# Patient Record
Sex: Male | Born: 1990 | Race: White | Hispanic: No | Marital: Married | Smoking: Never smoker
Health system: Southern US, Community
[De-identification: ages and names within clinical notes are randomized; demographics above are authoritative.]

## PROBLEM LIST (undated history)

## (undated) DIAGNOSIS — R519 Headache, unspecified: Secondary | ICD-10-CM

## (undated) DIAGNOSIS — R51 Headache: Secondary | ICD-10-CM

## (undated) HISTORY — DX: Headache, unspecified: R51.9

## (undated) HISTORY — DX: Headache: R51

---

## 2010-07-22 ENCOUNTER — Emergency Department (HOSPITAL_COMMUNITY)
Admission: EM | Admit: 2010-07-22 | Discharge: 2010-07-22 | Payer: Self-pay | Source: Home / Self Care | Admitting: Emergency Medicine

## 2013-12-30 ENCOUNTER — Encounter (HOSPITAL_COMMUNITY): Payer: Self-pay | Admitting: Emergency Medicine

## 2013-12-30 ENCOUNTER — Emergency Department (HOSPITAL_COMMUNITY)
Admission: EM | Admit: 2013-12-30 | Discharge: 2013-12-30 | Disposition: A | Discharge status: Home / Self Care | Payer: BC Managed Care – PPO | Attending: Emergency Medicine | Admitting: Emergency Medicine

## 2013-12-30 ENCOUNTER — Emergency Department (HOSPITAL_COMMUNITY): Payer: BC Managed Care – PPO

## 2013-12-30 DIAGNOSIS — S61409A Unspecified open wound of unspecified hand, initial encounter: Secondary | ICD-10-CM | POA: Insufficient documentation

## 2013-12-30 DIAGNOSIS — S0100XA Unspecified open wound of scalp, initial encounter: Secondary | ICD-10-CM | POA: Insufficient documentation

## 2013-12-30 DIAGNOSIS — T148XXA Other injury of unspecified body region, initial encounter: Secondary | ICD-10-CM

## 2013-12-30 DIAGNOSIS — S060X9A Concussion with loss of consciousness of unspecified duration, initial encounter: Secondary | ICD-10-CM | POA: Insufficient documentation

## 2013-12-30 DIAGNOSIS — Y9389 Activity, other specified: Secondary | ICD-10-CM | POA: Insufficient documentation

## 2013-12-30 DIAGNOSIS — Y92410 Unspecified street and highway as the place of occurrence of the external cause: Secondary | ICD-10-CM | POA: Insufficient documentation

## 2013-12-30 DIAGNOSIS — IMO0002 Reserved for concepts with insufficient information to code with codable children: Secondary | ICD-10-CM | POA: Insufficient documentation

## 2013-12-30 LAB — I-STAT CHEM 8, ED
BUN: 13 mg/dL (ref 6–23)
Calcium, Ion: 1.21 mmol/L (ref 1.12–1.23)
Chloride: 103 mEq/L (ref 96–112)
Creatinine, Ser: 1 mg/dL (ref 0.50–1.35)
Glucose, Bld: 106 mg/dL — ABNORMAL HIGH (ref 70–99)
HCT: 47 % (ref 39.0–52.0)
Hemoglobin: 16 g/dL (ref 13.0–17.0)
Potassium: 4.2 mEq/L (ref 3.7–5.3)
Sodium: 145 mEq/L (ref 137–147)
TCO2: 27 mmol/L (ref 0–100)

## 2013-12-30 LAB — CBG MONITORING, ED: Glucose-Capillary: 89 mg/dL (ref 70–99)

## 2013-12-30 MED ORDER — LIDOCAINE VISCOUS 2 % MT SOLN
15.0000 mL | Freq: Once | OROMUCOSAL | Status: AC
  Administered 2013-12-30: 15 mL via OROMUCOSAL
  Filled 2013-12-30: qty 15

## 2013-12-30 MED ORDER — KETOROLAC TROMETHAMINE 60 MG/2ML IM SOLN
60.0000 mg | Freq: Once | INTRAMUSCULAR | Status: AC
  Administered 2013-12-30: 60 mg via INTRAMUSCULAR
  Filled 2013-12-30: qty 2

## 2013-12-30 MED ORDER — CLINDAMYCIN HCL 150 MG PO CAPS: 300.0000 mg | ORAL_CAPSULE | Freq: Four times a day (QID) | ORAL | Status: DC

## 2013-12-30 MED ORDER — OXYCODONE-ACETAMINOPHEN 5-325 MG PO TABS: 2.0000 | ORAL_TABLET | ORAL | Status: DC | PRN

## 2013-12-30 MED ORDER — OXYCODONE-ACETAMINOPHEN 5-325 MG PO TABS
2.0000 | ORAL_TABLET | Freq: Once | ORAL | Status: AC
  Administered 2013-12-30: 2 via ORAL
  Filled 2013-12-30: qty 2

## 2013-12-30 NOTE — Discharge Instructions (Signed)
Abrasion An abrasion is a cut or scrape of the skin. Abrasions do not extend through all layers of the skin and most heal within 10 days. It is important to care for your abrasion properly to prevent infection. CAUSES  Most abrasions are caused by falling on, or gliding across, the ground or other surface. When your skin rubs on something, the outer and inner layer of skin rubs off, causing an abrasion. DIAGNOSIS  Your caregiver will be able to diagnose an abrasion during a physical exam.  TREATMENT  Your treatment depends on how large and deep the abrasion is. Generally, your abrasion will be cleaned with water and a mild soap to remove any dirt or debris. An antibiotic ointment may be put over the abrasion to prevent an infection. A bandage (dressing) may be wrapped around the abrasion to keep it from getting dirty.  You may need a tetanus shot if:  You cannot remember when you had your last tetanus shot.  You have never had a tetanus shot.  The injury broke your skin. If you get a tetanus shot, your arm may swell, get red, and feel warm to the touch. This is common and not a problem. If you need a tetanus shot and you choose not to have one, there is a rare chance of getting tetanus. Sickness from tetanus can be serious.  HOME CARE INSTRUCTIONS   If a dressing was applied, change it at least once a day or as directed by your caregiver. If the bandage sticks, soak it off with warm water.   Wash the area with water and a mild soap to remove all the ointment 2 times a day. Rinse off the soap and pat the area dry with a clean towel.   Reapply any ointment as directed by your caregiver. This will help prevent infection and keep the bandage from sticking. Use gauze over the wound and under the dressing to help keep the bandage from sticking.   Change your dressing right away if it becomes wet or dirty.   Only take over-the-counter or prescription medicines for pain, discomfort, or fever as  directed by your caregiver.   Follow up with your caregiver within 24 48 hours for a wound check, or as directed. If you were not given a wound-check appointment, look closely at your abrasion for redness, swelling, or pus. These are signs of infection. SEEK IMMEDIATE MEDICAL CARE IF:   You have increasing pain in the wound.   You have redness, swelling, or tenderness around the wound.   You have pus coming from the wound.   You have a fever or persistent symptoms for more than 2 3 days.  You have a fever and your symptoms suddenly get worse.  You have a bad smell coming from the wound or dressing.  MAKE SURE YOU:   Understand these instructions.  Will watch your condition.  Will get help right away if you are not doing well or get worse. Document Released: 05/12/2005 Document Revised: 07/19/2012 Document Reviewed: 07/06/2011 [facility] Patient Information 2014 [place], [place].  Laceration Care, Adult A laceration is a cut or lesion that goes through all layers of the skin and into the tissue just beneath the skin. TREATMENT  Some lacerations may not require closure. Some lacerations may not be able to be closed due to an increased risk of infection. It is important to see your caregiver as soon as possible after an injury to minimize the risk of infection and maximize  the opportunity for successful closure. If closure is appropriate, pain medicines may be given, if needed. The wound will be cleaned to help prevent infection. Your caregiver will use stitches (sutures), staples, wound glue (adhesive), or skin adhesive strips to repair the laceration. These tools bring the skin edges together to allow for faster healing and a better cosmetic outcome. However, all wounds will heal with a scar. Once the wound has healed, scarring can be minimized by covering the wound with sunscreen during the day for 1 full year. HOME CARE INSTRUCTIONS  For sutures or staples:  Keep the wound clean  and dry.  If you were given a bandage (dressing), you should change it at least once a day. Also, change the dressing if it becomes wet or dirty, or as directed by your caregiver.  Wash the wound with soap and water 2 times a day. Rinse the wound off with water to remove all soap. Pat the wound dry with a clean towel.  After cleaning, apply a thin layer of the antibiotic ointment as recommended by your caregiver. This will help prevent infection and keep the dressing from sticking.  You may shower as usual after the first 24 hours. Do not soak the wound in water until the sutures are removed.  Only take over-the-counter or prescription medicines for pain, discomfort, or fever as directed by your caregiver.  Get your sutures or staples removed as directed by your caregiver. For skin adhesive strips:  Keep the wound clean and dry.  Do not get the skin adhesive strips wet. You may bathe carefully, using caution to keep the wound dry.  If the wound gets wet, pat it dry with a clean towel.  Skin adhesive strips will fall off on their own. You may trim the strips as the wound heals. Do not remove skin adhesive strips that are still stuck to the wound. They will fall off in time. For wound adhesive:  You may briefly wet your wound in the shower or bath. Do not soak or scrub the wound. Do not swim. Avoid periods of heavy perspiration until the skin adhesive has fallen off on its own. After showering or bathing, gently pat the wound dry with a clean towel.  Do not apply liquid medicine, cream medicine, or ointment medicine to your wound while the skin adhesive is in place. This may loosen the film before your wound is healed.  If a dressing is placed over the wound, be careful not to apply tape directly over the skin adhesive. This may cause the adhesive to be pulled off before the wound is healed.  Avoid prolonged exposure to sunlight or tanning lamps while the skin adhesive is in place.  Exposure to ultraviolet light in the first year will darken the scar.  The skin adhesive will usually remain in place for 5 to 10 days, then naturally fall off the skin. Do not pick at the adhesive film. You may need a tetanus shot if:  You cannot remember when you had your last tetanus shot.  You have never had a tetanus shot. If you get a tetanus shot, your arm may swell, get red, and feel warm to the touch. This is common and not a problem. If you need a tetanus shot and you choose not to have one, there is a rare chance of getting tetanus. Sickness from tetanus can be serious. SEEK MEDICAL CARE IF:   You have redness, swelling, or increasing pain in the wound.  You  see a red line that goes away from the wound.  You have yellowish-white fluid (pus) coming from the wound.  You have a fever.  You notice a bad smell coming from the wound or dressing.  Your wound breaks open before or after sutures have been removed.  You notice something coming out of the wound such as wood or glass.  Your wound is on your hand or foot and you cannot move a finger or toe. SEEK IMMEDIATE MEDICAL CARE IF:   Your pain is not controlled with prescribed medicine.  You have severe swelling around the wound causing pain and numbness or a change in color in your arm, hand, leg, or foot.  Your wound splits open and starts bleeding.  You have worsening numbness, weakness, or loss of function of any joint around or beyond the wound.  You develop painful lumps near the wound or on the skin anywhere on your body. MAKE SURE YOU:   Understand these instructions.  Will watch your condition.  Will get help right away if you are not doing well or get worse. Document Released: 08/02/2005 Document Revised: 10/25/2011 Document Reviewed: 01/26/2011 [facility] Patient Information 2014 [place], [place].  Stitches, Staples, or Skin Adhesive Strips  Stitches (sutures), staples, and skin adhesive strips hold the  skin together as it heals. They will usually be in place for 7 days or less. HOME CARE  Wash your hands with soap and water before and after you touch your wound.  Only take medicine as told by your doctor.  Cover your wound only if your doctor told you to. Otherwise, leave it open to air.  Do not get your stitches wet or dirty. If they get dirty, dab them gently with a clean washcloth. Wet the washcloth with soapy water. Do not rub. Pat them dry gently.  Do not put medicine or medicated cream on your stitches unless your doctor told you to.  Do not take out your own stitches or staples. Skin adhesive strips will fall off by themselves.  Do not pick at the wound. Picking can cause an infection.  Do not miss your follow-up appointment.  If you have problems or questions, call your doctor. GET HELP RIGHT AWAY IF:   You have a temperature by mouth above 102 F (38.9 C), not controlled by medicine.  You have chills.  You have redness or pain around your stitches.  There is puffiness (swelling) around your stitches.  You notice fluid (drainage) from your stitches.  There is a bad smell coming from your wound. MAKE SURE YOU:  Understand these instructions.  Will watch your condition.  Will get help if you are not doing well or get worse. Document Released: 05/30/2009 Document Revised: 10/25/2011 Document Reviewed: 05/30/2009 [facility] Patient Information 2014 [place], [place]. Concussion, Adult A concussion, or closed-head injury, is a brain injury caused by a direct blow to the head or by a quick and sudden movement (jolt) of the head or neck. Concussions are usually not life-threatening. Even so, the effects of a concussion can be serious. If you have had a concussion before, you are more likely to experience concussion-like symptoms after a direct blow to the head.  CAUSES   Direct blow to the head, such as from running into another player during a soccer game, being hit in  a fight, or hitting your head on a hard surface.  A jolt of the head or neck that causes the brain to move back and forth inside  the skull, such as in a car crash. SIGNS AND SYMPTOMS  The signs of a concussion can be hard to notice. Early on, they may be missed by you, family members, and health care providers. You may look fine but act or feel differently. Symptoms are usually temporary, but they may last for days, weeks, or even longer. Some symptoms may appear right away while others may not show up for hours or days. Every head injury is different. Symptoms include:   Mild to moderate headaches that will not go away.  A feeling of pressure inside your head.  Having more trouble than usual:   Learning or remembering things you have heard.  Answering questions.  Paying attention or concentrating.   Organizing daily tasks.   Making decisions and solving problems.   Slowness in thinking, acting or reacting, speaking, or reading.   Getting lost or being easily confused.   Feeling tired all the time or lacking energy (fatigued).   Feeling drowsy.   Sleep disturbances.   Sleeping more than usual.   Sleeping less than usual.   Trouble falling asleep.   Trouble sleeping (insomnia).   Loss of balance or feeling lightheaded or dizzy.   Nausea or vomiting.   Numbness or tingling.   Increased sensitivity to:   Sounds.   Lights.   Distractions.   Vision problems or eyes that tire easily.   Diminished sense of taste or smell.   Ringing in the ears.   Mood changes such as feeling sad or anxious.   Becoming easily irritated or angry for little or no reason.   Lack of motivation.  Seeing or hearing things other people do not see or hear (hallucinations). DIAGNOSIS  Your health care provider can usually diagnose a concussion based on a description of your injury and symptoms. He or she will ask whether you passed out (lost  consciousness) and whether you are having trouble remembering events that happened right before and during your injury.  Your evaluation might include:   A brain scan to look for signs of injury to the brain. Even if the test shows no injury, you may still have a concussion.   Blood tests to be sure other problems are not present. TREATMENT   Concussions are usually treated in an emergency department, in urgent care, or at a clinic. You may need to stay in the hospital overnight for further treatment.   Tell your health care provider if you are taking any medicines, including prescription medicines, over-the-counter medicines, and natural remedies. Some medicines, such as blood thinners (anticoagulants) and aspirin, may increase the chance of complications. Also tell your health care provider whether you have had alcohol or are taking illegal drugs. This information may affect treatment.  Your health care provider will send you home with important instructions to follow.  How fast you will recover from a concussion depends on many factors. These factors include how severe your concussion is, what part of your brain was injured, your age, and how healthy you were before the concussion.  Most people with mild injuries recover fully. Recovery can take time. In general, recovery is slower in older persons. Also, persons who have had a concussion in the past or have other medical problems may find that it takes longer to recover from their current injury. HOME CARE INSTRUCTIONS  General Instructions  Carefully follow the directions your health care provider gave you.  Only take over-the-counter or prescription medicines for pain, discomfort, or fever  as directed by your health care provider.  Take only those medicines that your health care provider has approved.  Do not drink alcohol until your health care provider says you are well enough to do so. Alcohol and certain other drugs may slow  your recovery and can put you at risk of further injury.  If it is harder than usual to remember things, write them down.  If you are easily distracted, try to do one thing at a time. For example, do not try to watch TV while fixing dinner.  Talk with family members or close friends when making important decisions.  Keep all follow-up appointments. Repeated evaluation of your symptoms is recommended for your recovery.  Watch your symptoms and tell others to do the same. Complications sometimes occur after a concussion. Older adults with a brain injury may have a higher risk of serious complications such as of a blood clot on the brain.  Tell your teachers, school nurse, school counselor, coach, athletic trainer, or work Production designer, theatre/television/filmmanager about your injury, symptoms, and restrictions. Tell them about what you can or cannot do. They should watch for:   Increased problems with attention or concentration.   Increased difficulty remembering or learning new information.   Increased time needed to complete tasks or assignments.   Increased irritability or decreased ability to cope with stress.   Increased symptoms.   Rest. Rest helps the brain to heal. Make sure you:  Get plenty of sleep at night. Avoid staying up late at night.  Keep the same bedtime hours on weekends and weekdays.  Rest during the day. Take daytime naps or rest breaks when you feel tired.  Limit activities that require a lot of thought or concentration. These includes   Doing homework or job-related work.   Watching TV.   Working on the computer.  Avoid any situation where there is potential for another head injury (football, hockey, soccer, basketball, martial arts, downhill snow sports and horseback riding). Your condition will get worse every time you experience a concussion. You should avoid these activities until you are evaluated by the appropriate follow-up caregivers. Returning To Your Regular  Activities You will need to return to your normal activities slowly, not all at once. You must give your body and brain enough time for recovery.  Do not return to sports or other athletic activities until your health care provider tells you it is safe to do so.  Ask your health care provider when you can drive, ride a bicycle, or operate heavy machinery. Your ability to react may be slower after a brain injury. Never do these activities if you are dizzy.  Ask your health care provider about when you can return to work or school. Preventing Another Concussion It is very important to avoid another brain injury, especially before you have recovered. In rare cases, another injury can lead to permanent brain damage, brain swelling, or death. The risk of this is greatest during the first 7 10 days after a head injury. Avoid injuries by:   Wearing a seat belt when riding in a car.   Drinking alcohol only in moderation.   Wearing a helmet when biking, skiing, skateboarding, skating, or doing similar activities.  Avoiding activities that could lead to a second concussion, such as contact or recreational sports, until your health care provider says it is OK.  Taking safety measures in your home.   Remove clutter and tripping hazards from floors and stairways.   Use grab  bars in bathrooms and handrails by stairs.   Place non-slip mats on floors and in bathtubs.   Improve lighting in dim areas. SEEK MEDICAL CARE IF:   You have increased problems paying attention or concentrating.   You have increased difficulty remembering or learning new information.   You need more time to complete tasks or assignments than before.   You have increased irritability or decreased ability to cope with stress.  You have more symptoms than before. Seek medical care if you have any of the following symptoms for more than 2 weeks after your injury:   Lasting (chronic) headaches.   Dizziness or  balance problems.   Nausea.  Vision problems.   Increased sensitivity to noise or light.   Depression or mood swings.   Anxiety or irritability.   Memory problems.   Difficulty concentrating or paying attention.   Sleep problems.   Feeling tired all the time. SEEK IMMEDIATE MEDICAL CARE IF:   You have severe or worsening headaches. These may be a sign of a blood clot in the brain.  You have weakness (even if only in one hand, leg, or part of the face).  You have numbness.  You have decreased coordination.   You vomit repeatedly.  You have increased sleepiness.  One pupil is larger than the other.   You have convulsions.   You have slurred speech.   You have increased confusion. This may be a sign of a blood clot in the brain.  You have increased restlessness, agitation, or irritability.   You are unable to recognize people or places.   You have neck pain.   It is difficult to wake you up.   You have unusual behavior changes.   You lose consciousness. MAKE SURE YOU:   Understand these instructions.  Will watch your condition.  Will get help right away if you are not doing well or get worse. Document Released: 10/23/2003 Document Revised: 04/04/2013 Document Reviewed: 02/22/2013 [facility] Patient Information 2014 [place], [place].
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
## 2014-01-10 ENCOUNTER — Encounter (HOSPITAL_COMMUNITY): Payer: Self-pay | Admitting: Emergency Medicine

## 2014-01-10 ENCOUNTER — Emergency Department (HOSPITAL_COMMUNITY)
Admission: EM | Admit: 2014-01-10 | Discharge: 2014-01-10 | Disposition: A | Discharge status: Home / Self Care | Payer: BC Managed Care – PPO | Source: Home / Self Care | Attending: Emergency Medicine | Admitting: Emergency Medicine

## 2014-01-10 DIAGNOSIS — T07XXXA Unspecified multiple injuries, initial encounter: Secondary | ICD-10-CM

## 2014-01-10 DIAGNOSIS — S060XAA Concussion with loss of consciousness status unknown, initial encounter: Secondary | ICD-10-CM

## 2014-01-10 DIAGNOSIS — Z4802 Encounter for removal of sutures: Secondary | ICD-10-CM

## 2014-01-10 DIAGNOSIS — S060X9A Concussion with loss of consciousness of unspecified duration, initial encounter: Secondary | ICD-10-CM

## 2014-01-10 DIAGNOSIS — S41109A Unspecified open wound of unspecified upper arm, initial encounter: Secondary | ICD-10-CM

## 2014-01-10 DIAGNOSIS — IMO0002 Reserved for concepts with insufficient information to code with codable children: Secondary | ICD-10-CM
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
## 2014-01-10 NOTE — Discharge Instructions (Signed)
Wash with soap and water and apply antibiotic ointment.  Watch for signs of infection.
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
## 2017-02-02 ENCOUNTER — Ambulatory Visit (INDEPENDENT_AMBULATORY_CARE_PROVIDER_SITE_OTHER): Payer: BLUE CROSS/BLUE SHIELD | Admitting: Family

## 2017-02-02 ENCOUNTER — Other Ambulatory Visit (INDEPENDENT_AMBULATORY_CARE_PROVIDER_SITE_OTHER): Payer: BLUE CROSS/BLUE SHIELD

## 2017-02-02 ENCOUNTER — Encounter: Payer: Self-pay | Admitting: Family

## 2017-02-02 VITALS — BP 118/74 | HR 60 | Temp 98.0°F | Ht 69.25 in | Wt 210.0 lb

## 2017-02-02 DIAGNOSIS — Z0000 Encounter for general adult medical examination without abnormal findings: Secondary | ICD-10-CM

## 2017-02-02 HISTORY — DX: Encounter for general adult medical examination without abnormal findings: Z00.00

## 2017-02-02 LAB — COMPREHENSIVE METABOLIC PANEL
ALK PHOS: 53 U/L (ref 39–117)
ALT: 28 U/L (ref 0–53)
AST: 26 U/L (ref 0–37)
Albumin: 4.7 g/dL (ref 3.5–5.2)
BUN: 17 mg/dL (ref 6–23)
CHLORIDE: 103 meq/L (ref 96–112)
CO2: 29 mEq/L (ref 19–32)
Calcium: 10 mg/dL (ref 8.4–10.5)
Creatinine, Ser: 0.83 mg/dL (ref 0.40–1.50)
GFR: 117.75 mL/min (ref >60.00)
GLUCOSE: 96 mg/dL (ref 70–99)
POTASSIUM: 4.5 meq/L (ref 3.5–5.1)
SODIUM: 140 meq/L (ref 135–145)
TOTAL PROTEIN: 7.1 g/dL (ref 6.0–8.3)
Total Bilirubin: 0.9 mg/dL (ref 0.2–1.2)

## 2017-02-02 LAB — CBC
HEMATOCRIT: 46.7 % (ref 39.0–52.0)
HEMOGLOBIN: 16.1 g/dL (ref 13.0–17.0)
MCHC: 34.6 g/dL (ref 30.0–36.0)
MCV: 91 fl (ref 78.0–100.0)
Platelets: 235 10*3/uL (ref 150.0–400.0)
RBC: 5.13 Mil/uL (ref 4.22–5.81)
RDW: 12.3 % (ref 11.5–15.5)
WBC: 5.7 10*3/uL (ref 4.0–10.5)

## 2017-02-02 LAB — LIPID PANEL
Cholesterol: 138 mg/dL (ref 0–200)
HDL: 51.6 mg/dL (ref >39.00)
LDL CALC: 77 mg/dL (ref 0–99)
NONHDL: 86.55
Total CHOL/HDL Ratio: 3
Triglycerides: 46 mg/dL (ref 0.0–149.0)
VLDL: 9.2 mg/dL (ref 0.0–40.0)

## 2017-02-02 NOTE — Patient Instructions (Signed)
Thank you for choosing [facility].  SUMMARY AND INSTRUCTIONS:  Labs:  Please stop by the lab on the lower level of the building for your blood work. Your results will be released to MyChart (or called to you) after review, usually within 72 hours after test completion. If any changes need to be made, you will be notified at that same time.  1.) The lab is open from 7:30am to 5:30 pm Monday-Friday 2.) No appointment is necessary 3.) Fasting (if needed) is 6-8 hours after food and drink; black coffee and water are okay   Follow up:  If your symptoms worsen or fail to improve, please contact our office for further instruction, or in case of emergency go directly to the emergency room at the closest medical facility.     Health Maintenance, Male A healthy lifestyle and preventive care is important for your health and wellness. Ask your health care provider about what schedule of regular examinations is right for you. What should I know about weight and diet? Eat a Healthy Diet  Eat plenty of vegetables, fruits, whole grains, low-fat dairy products, and lean protein.  Do not eat a lot of foods high in solid fats, added sugars, or salt.  Maintain a Healthy Weight Regular exercise can help you achieve or maintain a healthy weight. You should:  Do at least 150 minutes of exercise each week. The exercise should increase your heart rate and make you sweat (moderate-intensity exercise).  Do strength-training exercises at least twice a week.  Watch Your Levels of Cholesterol and Blood Lipids  Have your blood tested for lipids and cholesterol every 5 years starting at 27 years of age. If you are at high risk for heart disease, you should start having your blood tested when you are 27 years old. You may need to have your cholesterol levels checked more often if: ? Your lipid or cholesterol levels are high. ? You are older than 27 years of age. ? You are at high risk for heart  disease.  What should I know about cancer screening? Many types of cancers can be detected early and may often be prevented. Lung Cancer  You should be screened every year for lung cancer if: ? You are a current smoker who has smoked for at least 30 years. ? You are a former smoker who has quit within the past 15 years.  Talk to your health care provider about your screening options, when you should start screening, and how often you should be screened.  Colorectal Cancer  Routine colorectal cancer screening usually begins at 27 years of age and should be repeated every 5-10 years until you are 27 years old. You may need to be screened more often if early forms of precancerous polyps or small growths are found. Your health care provider may recommend screening at an earlier age if you have risk factors for colon cancer.  Your health care provider may recommend using home test kits to check for hidden blood in the stool.  A small camera at the end of a tube can be used to examine your colon (sigmoidoscopy or colonoscopy). This checks for the earliest forms of colorectal cancer.  Prostate and Testicular Cancer  Depending on your age and overall health, your health care provider may do certain tests to screen for prostate and testicular cancer.  Talk to your health care provider about any symptoms or concerns you have about testicular or prostate cancer.  Skin Cancer  Check   your skin from head to toe regularly.  Tell your health care provider about any new moles or changes in moles, especially if: ? There is a change in a mole's size, shape, or color. ? You have a mole that is larger than a pencil eraser.  Always use sunscreen. Apply sunscreen liberally and repeat throughout the day.  Protect yourself by wearing long sleeves, pants, a wide-brimmed hat, and sunglasses when outside.  What should I know about heart disease, diabetes, and high blood pressure?  If you are 18-39 years  of age, have your blood pressure checked every 3-5 years. If you are 40 years of age or older, have your blood pressure checked every year. You should have your blood pressure measured twice-once when you are at a hospital or clinic, and once when you are not at a hospital or clinic. Record the average of the two measurements. To check your blood pressure when you are not at a hospital or clinic, you can use: ? An automated blood pressure machine at a pharmacy. ? A home blood pressure monitor.  Talk to your health care provider about your target blood pressure.  If you are between 45-79 years old, ask your health care provider if you should take aspirin to prevent heart disease.  Have regular diabetes screenings by checking your fasting blood sugar level. ? If you are at a normal weight and have a low risk for diabetes, have this test once every three years after the age of 45. ? If you are overweight and have a high risk for diabetes, consider being tested at a younger age or more often.  A one-time screening for abdominal aortic aneurysm (AAA) by ultrasound is recommended for men aged 65-75 years who are current or former smokers. What should I know about preventing infection? Hepatitis B If you have a higher risk for hepatitis B, you should be screened for this virus. Talk with your health care provider to find out if you are at risk for hepatitis B infection. Hepatitis C Blood testing is recommended for:  Everyone born from 1945 through 1965.  Anyone with known risk factors for hepatitis C.  Sexually Transmitted Diseases (STDs)  You should be screened each year for STDs including gonorrhea and chlamydia if: ? You are sexually active and are younger than 27 years of age. ? You are older than 27 years of age and your health care provider tells you that you are at risk for this type of infection. ? Your sexual activity has changed since you were last screened and you are at an increased  risk for chlamydia or gonorrhea. Ask your health care provider if you are at risk.  Talk with your health care provider about whether you are at high risk of being infected with HIV. Your health care provider may recommend a prescription medicine to help prevent HIV infection.  What else can I do?  Schedule regular health, dental, and eye exams.  Stay current with your vaccines (immunizations).  Do not use any tobacco products, such as cigarettes, chewing tobacco, and e-cigarettes. If you need help quitting, ask your health care provider.  Limit alcohol intake to no more than 2 drinks per day. One drink equals 12 ounces of beer, 5 ounces of wine, or 1 ounces of hard liquor.  Do not use street drugs.  Do not share needles.  Ask your health care provider for help if you need support or information about quitting drugs.  Tell your   health care provider if you often feel depressed.  Tell your health care provider if you have ever been abused or do not feel safe at home. This information is not intended to replace advice given to you by your health care provider. Make sure you discuss any questions you have with your health care provider. Document Released: 01/29/2008 Document Revised: 03/31/2016 Document Reviewed: 05/06/2015 [employer] Interactive Patient Education  2018 [employer]  

## 2017-02-02 NOTE — Progress Notes (Signed)
Subjective:    Patient ID: Joseph Kennedy, male    DOB: 08/30/1990, 26 y.o.   MRN: [id number]  Chief Complaint  Patient presents with  . Establish Care  . Annual Exam    HPI:  Joseph SloopBrandon M Chiao is a 26 y.o. male who presents today for an annual wellness visit.   1) Health Maintenance -   Diet - Averages about 3 meals per day consisting of a regular diet; No caffeine intake.   Exercise - 4-5x per week consisting of cardio and resistance training    2) Preventative Exams / Immunizations:  Dental -- Up to date  Vision -- Due for exam    Health Maintenance  Topic Date Due  . HIV Screening  09/01/2004  . INFLUENZA VACCINE  03/16/2017  . TETANUS/TDAP  12/18/2023     There is no immunization history on file for this patient.   No Known Allergies   Outpatient Medications Prior to Visit  Medication Sig Dispense Refill  . Multiple Vitamin (MULTIVITAMIN WITH MINERALS) TABS tablet Take 1 tablet by mouth daily.    . clindamycin (CLEOCIN) 150 MG capsule Take 2 capsules (300 mg total) by mouth 4 (four) times daily. 80 capsule 0  . hydrocortisone cream 1 % Apply 1 application topically daily as needed for itching.    Marland Kitchen. oxyCODONE-acetaminophen (PERCOCET) 5-325 MG per tablet Take 2 tablets by mouth every 4 (four) hours as needed. 30 tablet 0   No facility-administered medications prior to visit.      Past Medical History:  Diagnosis Date  . Headache      No past surgical history on file.   Family History  Problem Relation Age of Onset  . Healthy Mother   . Hypertension Father   . Cancer Paternal Aunt        colon  . Diabetes Maternal Grandmother   . Hypertension Maternal Grandfather      Social History   Social History  . Marital status: Single    Spouse name: N/A  . Number of children: 0  . Years of education: 14   Occupational History  . [employer]    Social History Main Topics  . Smoking status: Never Smoker  . Smokeless  tobacco: Never Used  . Alcohol use No  . Drug use: No  . Sexual activity: Not on file   Other Topics Concern  . Not on file   Social History Narrative   Fun/Hobby: Work on cars, mountain biking      Review of Systems  Constitutional: Denies fever, chills, fatigue, or significant weight gain/loss. HENT: Head: Denies headache or neck pain Ears: Denies changes in hearing, ringing in ears, earache, drainage Nose: Denies discharge, stuffiness, itching, nosebleed, sinus pain Throat: Denies sore throat, hoarseness, dry mouth, sores, thrush Eyes: Denies loss/changes in vision, pain, redness, blurry/double vision, flashing lights Cardiovascular: Denies chest pain/discomfort, tightness, palpitations, shortness of breath with activity, difficulty lying down, swelling, sudden awakening with shortness of breath Respiratory: Denies shortness of breath, cough, sputum production, wheezing Gastrointestinal: Denies dysphasia, heartburn, change in appetite, nausea, change in bowel habits, rectal bleeding, constipation, diarrhea, yellow skin or eyes Genitourinary: Denies frequency, urgency, burning/pain, blood in urine, incontinence, change in urinary strength. Musculoskeletal: Denies muscle/joint pain, stiffness, back pain, redness or swelling of joints, trauma Skin: Denies rashes, lumps, itching, dryness, color changes, or hair/nail changes Neurological: Denies dizziness, fainting, seizures, weakness, numbness, tingling, tremor Psychiatric - Denies nervousness, stress, depression or memory loss Endocrine: Denies heat  or cold intolerance, sweating, frequent urination, excessive thirst, changes in appetite Hematologic: Denies ease of bruising or bleeding     Objective:     BP 118/74 (BP Location: Left Arm, Patient Position: Sitting, Cuff Size: Normal)   Pulse 60   Temp 98 F (36.7 C) (Oral)   Ht 5' 9.25" (1.759 m)   Wt 210 lb (95.3 kg)   SpO2 98%   BMI 30.79 kg/m  Nursing note and vital  signs reviewed.  Physical Exam  Constitutional: He is oriented to person, place, and time. He appears well-developed and well-nourished.  HENT:  Head: Normocephalic.  Right Ear: Hearing, tympanic membrane, external ear and ear canal normal.  Left Ear: Hearing, tympanic membrane, external ear and ear canal normal.  Nose: Nose normal.  Mouth/Throat: Uvula is midline, oropharynx is clear and moist and mucous membranes are normal.  Eyes: Conjunctivae and EOM are normal. Pupils are equal, round, and reactive to light.  Neck: Neck supple. No JVD present. No tracheal deviation present. No thyromegaly present.  Cardiovascular: Normal rate, regular rhythm, normal heart sounds and intact distal pulses.   Pulmonary/Chest: Effort normal and breath sounds normal.  Abdominal: Soft. Bowel sounds are normal. He exhibits no distension and no mass. There is no tenderness. There is no rebound and no guarding.  Musculoskeletal: Normal range of motion. He exhibits no edema or tenderness.  Lymphadenopathy:    He has no cervical adenopathy.  Neurological: He is alert and oriented to person, place, and time. He has normal reflexes. No cranial nerve deficit. He exhibits normal muscle tone. Coordination normal.  Skin: Skin is warm and dry.  Psychiatric: He has a normal mood and affect. His behavior is normal. Judgment and thought content normal.       Assessment & Plan:   Problem List Items Addressed This Visit      Other   Routine adult health maintenance - Primary    1) Anticipatory Guidance: Discussed importance of wearing a seatbelt while driving and not texting while driving; changing batteries in smoke detector at least once annually; wearing suntan lotion when outside; eating a balanced and moderate diet; getting physical activity at least 30 minutes per day.  2) Immunizations / Screenings / Labs:  All immunizations are up-to-date per recommendations. Due for a vision screen encouraged to be completed  independently. All other screenings are up-to-date per recommendations. Obtain CBC, CMET, and lipid profile.    Overall well exam with risk factors for cardiovascular disease being minimal. He eats well and exercises regularly. His BMI is most likely misleading secondary to muscle mass. Encouraged body fat testing to check current body habitus status. Continue healthy lifestyle behaviors and choices. Follow-up prevention exam in 1 year. Follow-up office visit pending blood work as needed.      Relevant Orders   CBC   Comprehensive metabolic panel   Lipid panel       I have discontinued Mr. Mierzejewski hydrocortisone cream, oxyCODONE-acetaminophen, and clindamycin. I am also having him maintain his multivitamin with minerals.   Follow-up: Return if symptoms worsen or fail to improve.   Jeanine Luz, FNP

## 2017-02-02 NOTE — Assessment & Plan Note (Signed)
1) Anticipatory Guidance: Discussed importance of wearing a seatbelt while driving and not texting while driving; changing batteries in smoke detector at least once annually; wearing suntan lotion when outside; eating a balanced and moderate diet; getting physical activity at least 30 minutes per day.  2) Immunizations / Screenings / Labs:  All immunizations are up-to-date per recommendations. Due for a vision screen encouraged to be completed independently. All other screenings are up-to-date per recommendations. Obtain CBC, CMET, and lipid profile.    Overall well exam with risk factors for cardiovascular disease being minimal. He eats well and exercises regularly. His BMI is most likely misleading secondary to muscle mass. Encouraged body fat testing to check current body habitus status. Continue healthy lifestyle behaviors and choices. Follow-up prevention exam in 1 year. Follow-up office visit pending blood work as needed.

## 2019-01-02 DIAGNOSIS — Z20828 Contact with and (suspected) exposure to other viral communicable diseases: Secondary | ICD-10-CM | POA: Diagnosis not present

## 2019-01-02 DIAGNOSIS — Z6834 Body mass index (BMI) 34.0-34.9, adult: Secondary | ICD-10-CM | POA: Diagnosis not present

## 2020-06-05 ENCOUNTER — Telehealth: Payer: Self-pay | Admitting: Family

## 2020-06-05 NOTE — Telephone Encounter (Signed)
That is fine. I have not been with Auberry in 3 years

## 2020-06-05 NOTE — Telephone Encounter (Signed)
12/8 appointment with debbie

## 2020-06-05 NOTE — Telephone Encounter (Signed)
OK to schedule him

## 2020-06-05 NOTE — Telephone Encounter (Signed)
Pt called wanting to transfer care from dr Carver Fila to Nicoletta Ba to schedule

## 2020-07-23 ENCOUNTER — Encounter: Payer: Self-pay | Admitting: Family Medicine

## 2020-07-23 ENCOUNTER — Ambulatory Visit (INDEPENDENT_AMBULATORY_CARE_PROVIDER_SITE_OTHER): Payer: BC Managed Care – PPO | Admitting: Family Medicine

## 2020-07-23 ENCOUNTER — Other Ambulatory Visit: Payer: Self-pay

## 2020-07-23 VITALS — BP 130/82 | HR 107 | Temp 98.7°F | Ht 70.0 in | Wt 269.1 lb

## 2020-07-23 DIAGNOSIS — Z6838 Body mass index (BMI) 38.0-38.9, adult: Secondary | ICD-10-CM | POA: Diagnosis not present

## 2020-07-23 DIAGNOSIS — E6609 Other obesity due to excess calories: Secondary | ICD-10-CM | POA: Insufficient documentation

## 2020-07-23 DIAGNOSIS — Z7689 Persons encountering health services in other specified circumstances: Secondary | ICD-10-CM

## 2020-07-23 DIAGNOSIS — Z23 Encounter for immunization: Secondary | ICD-10-CM | POA: Diagnosis not present

## 2020-07-23 DIAGNOSIS — Z800 Family history of malignant neoplasm of digestive organs: Secondary | ICD-10-CM | POA: Diagnosis not present

## 2020-07-23 DIAGNOSIS — R0683 Snoring: Secondary | ICD-10-CM | POA: Diagnosis not present

## 2020-07-23 DIAGNOSIS — Z6835 Body mass index (BMI) 35.0-35.9, adult: Secondary | ICD-10-CM | POA: Insufficient documentation

## 2020-07-23 HISTORY — DX: Other obesity due to excess calories: E66.09

## 2020-07-23 NOTE — Progress Notes (Signed)
Subjective:    Patient ID: Joseph Kennedy, male    DOB: Feb 21, 1991, 29 y.o.   MRN: [id number]  HPI Chief Complaint  Patient presents with  . New Patient (Initial Visit)   This is a 29 yo male who presents today to establish care. He is accompanied by his wife, Darl Pikes. 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 Enjoys outdoors, cutting wood. Has 2 cats and a dog.    Last CPE- several years ago Tdap- overdue, will have today Flu- regular, will have today Covid- vaccinated Dental- regular before Covid Eye- 1019 Exercise- going to gym 4-5 week Diet- smoothies, chicken, vegetables, some juice, occasional Gatorade, snack before the gym- trail mix. Wife cooks.   Family history- paternal aunt died of colon cancer, no first degree relative with colon cancer diagnosed prior to age 20  Snoring- since childhood, gotten worse over time, wife reports apneic episodes, getting worse, sleeps on sofa for several hours then goes to bed for total of about 11 hours. Feels rested. No daytime sleepiness. Broken nose as a child.   Allergic rhinitis- as a kid, improved, some chronic changes on CT. No current symptoms.     Review of Systems Denies visual change, headache, runny nose, sore throat, ear pain, chest pain, shortness of breath, abdominal pain, diarrhea/constipation, dysuria, hematuria, blood in stools, muscle or joint pain    Objective:   Physical Exam Physical Exam  Constitutional: Oriented to person, place, and time. Appears well-developed and well-nourished.  Obese HENT:  Head: Normocephalic and atraumatic.  Eyes: Conjunctivae are normal.  Neck: Normal range of motion. Neck supple.  Cardiovascular: Normal rate, regular rhythm and normal heart sounds.   Pulmonary/Chest: Effort normal and breath sounds normal.  Musculoskeletal: No lower extremity edema.   Neurological: Alert and oriented to person, place, and time.  Skin: Skin is warm and dry.  Psychiatric: Normal mood  and affect. Behavior is normal. Judgment and thought content normal.  Vitals reviewed.     BP 130/82 (BP Location: Right Arm, Patient Position: Sitting, Cuff Size: Large)   Pulse (!) 107   Temp 98.7 F (37.1 C) (Temporal)   Ht 5\' 10"  (1.778 m)   Wt 269 lb 1.9 oz (122.1 kg)   SpO2 98%   BMI 38.61 kg/m  Wt Readings from Last 3 Encounters:  07/23/20 269 lb 1.9 oz (122.1 kg)  02/02/17 210 lb (95.3 kg)  12/30/13 190 lb (86.2 kg)       Assessment & Plan:  1. Encounter to establish care -Reviewed available records in EMR and advised on health maintenance recommendations  2. Snoring - Ambulatory referral to ENT  3. Class 2 obesity due to excess calories without serious comorbidity with body mass index (BMI) of 38.0 to 38.9 in adult -Discussed diet and encouraged him to decrease his carbohydrate intake, decrease his eating window, continue activity and exercise - Lipid Panel - Comprehensive metabolic panel - CBC with Differential - TSH - Vitamin D, 25-hydroxy - Hemoglobin A1c  4. Need for immunization against influenza - Flu Vaccine QUAD 36+ mos IM  5. Need for diphtheria-tetanus-pertussis (Tdap) vaccine - Tdap vaccine greater than or equal to 7yo IM  6. Family history of colon cancer -Second-degree relative, this does not change recommendations for his personal colon cancer screening which should start at 97.  Discussed this with him and he should notify provider if any change in family history.  -Follow-up in 1 year if labs normal  This visit occurred  during the SARS-CoV-2 public health emergency.  Safety protocols were in place, including screening questions prior to the visit, additional usage of staff PPE, and extensive cleaning of exam room while observing appropriate contact time as indicated for disinfecting solutions.      Olean Ree, FNP-BC  [facility], [facility]  07/23/2020 5:18 PM

## 2020-07-24 LAB — COMPREHENSIVE METABOLIC PANEL
ALT: 71 U/L — ABNORMAL HIGH (ref 0–53)
AST: 34 U/L (ref 0–37)
Albumin: 4.6 g/dL (ref 3.5–5.2)
Alkaline Phosphatase: 73 U/L (ref 39–117)
BUN: 18 mg/dL (ref 6–23)
CO2: 29 mEq/L (ref 19–32)
Calcium: 9.7 mg/dL (ref 8.4–10.5)
Chloride: 105 mEq/L (ref 96–112)
Creatinine, Ser: 0.9 mg/dL (ref 0.40–1.50)
GFR: 114.3 mL/min (ref >60.00)
Glucose, Bld: 92 mg/dL (ref 70–99)
Potassium: 4.1 mEq/L (ref 3.5–5.1)
Sodium: 142 mEq/L (ref 135–145)
Total Bilirubin: 0.4 mg/dL (ref 0.2–1.2)
Total Protein: 7.3 g/dL (ref 6.0–8.3)

## 2020-07-24 LAB — CBC WITH DIFFERENTIAL/PLATELET
Basophils Absolute: 0.1 10*3/uL (ref 0.0–0.1)
Basophils Relative: 1 % (ref 0.0–3.0)
Eosinophils Absolute: 0.6 10*3/uL (ref 0.0–0.7)
Eosinophils Relative: 5.8 % — ABNORMAL HIGH (ref 0.0–5.0)
HCT: 47.1 % (ref 39.0–52.0)
Hemoglobin: 16.1 g/dL (ref 13.0–17.0)
Lymphocytes Relative: 31.7 % (ref 12.0–46.0)
Lymphs Abs: 3.1 10*3/uL (ref 0.7–4.0)
MCHC: 34.1 g/dL (ref 30.0–36.0)
MCV: 88 fl (ref 78.0–100.0)
Monocytes Absolute: 0.7 10*3/uL (ref 0.1–1.0)
Monocytes Relative: 7.4 % (ref 3.0–12.0)
Neutro Abs: 5.4 10*3/uL (ref 1.4–7.7)
Neutrophils Relative %: 54.1 % (ref 43.0–77.0)
Platelets: 318 10*3/uL (ref 150.0–400.0)
RBC: 5.36 Mil/uL (ref 4.22–5.81)
RDW: 13.1 % (ref 11.5–15.5)
WBC: 9.9 10*3/uL (ref 4.0–10.5)

## 2020-07-24 LAB — VITAMIN D 25 HYDROXY (VIT D DEFICIENCY, FRACTURES): VITD: 35.21 ng/mL (ref 30.00–100.00)

## 2020-07-24 LAB — LIPID PANEL
Cholesterol: 188 mg/dL (ref 0–200)
HDL: 44 mg/dL (ref >39.00)
LDL Cholesterol: 116 mg/dL — ABNORMAL HIGH (ref 0–99)
NonHDL: 143.6
Total CHOL/HDL Ratio: 4
Triglycerides: 136 mg/dL (ref 0.0–149.0)
VLDL: 27.2 mg/dL (ref 0.0–40.0)

## 2020-07-24 LAB — TSH: TSH: 3.46 u[IU]/mL (ref 0.35–4.50)

## 2020-07-24 LAB — HEMOGLOBIN A1C: Hgb A1c MFr Bld: 5.2 % (ref 4.6–6.5)

## 2020-08-05 DIAGNOSIS — G4733 Obstructive sleep apnea (adult) (pediatric): Secondary | ICD-10-CM | POA: Diagnosis not present

## 2020-08-05 DIAGNOSIS — R0683 Snoring: Secondary | ICD-10-CM | POA: Diagnosis not present

## 2020-08-05 DIAGNOSIS — R0981 Nasal congestion: Secondary | ICD-10-CM | POA: Diagnosis not present

## 2021-08-03 ENCOUNTER — Other Ambulatory Visit: Payer: Self-pay

## 2021-08-03 ENCOUNTER — Ambulatory Visit (INDEPENDENT_AMBULATORY_CARE_PROVIDER_SITE_OTHER): Payer: BC Managed Care – PPO | Admitting: Family

## 2021-08-03 ENCOUNTER — Encounter: Payer: Self-pay | Admitting: Family

## 2021-08-03 VITALS — BP 129/84 | HR 86 | Temp 97.7°F | Ht 70.0 in | Wt 250.0 lb

## 2021-08-03 DIAGNOSIS — Z0000 Encounter for general adult medical examination without abnormal findings: Secondary | ICD-10-CM | POA: Insufficient documentation

## 2021-08-03 DIAGNOSIS — Z6835 Body mass index (BMI) 35.0-35.9, adult: Secondary | ICD-10-CM | POA: Diagnosis not present

## 2021-08-03 DIAGNOSIS — E6609 Other obesity due to excess calories: Secondary | ICD-10-CM

## 2021-08-03 HISTORY — DX: Encounter for general adult medical examination without abnormal findings: Z00.00

## 2021-08-03 LAB — COMPREHENSIVE METABOLIC PANEL
ALT: 24 U/L (ref 0–53)
AST: 23 U/L (ref 0–37)
Albumin: 4.4 g/dL (ref 3.5–5.2)
Alkaline Phosphatase: 67 U/L (ref 39–117)
BUN: 19 mg/dL (ref 6–23)
CO2: 27 mEq/L (ref 19–32)
Calcium: 9.7 mg/dL (ref 8.4–10.5)
Chloride: 104 mEq/L (ref 96–112)
Creatinine, Ser: 0.75 mg/dL (ref 0.40–1.50)
GFR: 119.9 mL/min (ref >60.00)
Glucose, Bld: 86 mg/dL (ref 70–99)
Potassium: 4.3 mEq/L (ref 3.5–5.1)
Sodium: 139 mEq/L (ref 135–145)
Total Bilirubin: 0.6 mg/dL (ref 0.2–1.2)
Total Protein: 7.3 g/dL (ref 6.0–8.3)

## 2021-08-03 LAB — CBC WITH DIFFERENTIAL/PLATELET
Basophils Absolute: 0 10*3/uL (ref 0.0–0.1)
Basophils Relative: 0.6 % (ref 0.0–3.0)
Eosinophils Absolute: 0.3 10*3/uL (ref 0.0–0.7)
Eosinophils Relative: 3.9 % (ref 0.0–5.0)
HCT: 47 % (ref 39.0–52.0)
Hemoglobin: 15.9 g/dL (ref 13.0–17.0)
Lymphocytes Relative: 26.8 % (ref 12.0–46.0)
Lymphs Abs: 1.8 10*3/uL (ref 0.7–4.0)
MCHC: 33.8 g/dL (ref 30.0–36.0)
MCV: 89.6 fl (ref 78.0–100.0)
Monocytes Absolute: 0.5 10*3/uL (ref 0.1–1.0)
Monocytes Relative: 6.7 % (ref 3.0–12.0)
Neutro Abs: 4.2 10*3/uL (ref 1.4–7.7)
Neutrophils Relative %: 62 % (ref 43.0–77.0)
Platelets: 257 10*3/uL (ref 150.0–400.0)
RBC: 5.25 Mil/uL (ref 4.22–5.81)
RDW: 12.9 % (ref 11.5–15.5)
WBC: 6.8 10*3/uL (ref 4.0–10.5)

## 2021-08-03 LAB — LIPID PANEL
Cholesterol: 161 mg/dL (ref 0–200)
HDL: 51.3 mg/dL (ref >39.00)
LDL Cholesterol: 99 mg/dL (ref 0–99)
NonHDL: 109.87
Total CHOL/HDL Ratio: 3
Triglycerides: 53 mg/dL (ref 0.0–149.0)
VLDL: 10.6 mg/dL (ref 0.0–40.0)

## 2021-08-03 LAB — TSH: TSH: 1.98 u[IU]/mL (ref 0.35–5.50)

## 2021-08-03 NOTE — Patient Instructions (Signed)

## 2021-08-03 NOTE — Assessment & Plan Note (Signed)
Pt has lost 19lbs since last year, still working on diet and exercise to lose more.  Encouraged continued success, and offered wt loss strategies.

## 2021-08-03 NOTE — Assessment & Plan Note (Deleted)
No concerns. Pt has lost 19lbs since last year, still working on diet and exercise to lose more.  Encouraged continued success, and offered wt loss strategies.

## 2021-08-03 NOTE — Progress Notes (Signed)
Phone: [phone]   Subjective:  Patient 30 y.o. male presenting for annual physical.  Chief Complaint  Patient presents with   Annual Exam   Obesity    See problem oriented charting- ROS- full  review of systems was completed and negative    The following were reviewed and entered/updated in epic: Past Medical History:  Diagnosis Date   Headache    Patient Active Problem List   Diagnosis Date Noted   Annual physical exam 08/03/2021   Snoring 07/23/2020   Class 2 obesity due to excess calories without serious comorbidity with body mass index (BMI) of 35.0 to 35.9 in adult 07/23/2020   Routine adult health maintenance 02/02/2017   History reviewed. No pertinent surgical history.  Family History  Problem Relation Age of Onset   Healthy Mother    Hypertension Father    Cancer Paternal Aunt        colon   Diabetes Maternal Grandmother    Hypertension Maternal Grandfather     Medications- reviewed and updated Current Outpatient Medications  Medication Sig Dispense Refill   Multiple Vitamin (MULTIVITAMIN WITH MINERALS) TABS tablet Take 1 tablet by mouth daily.     No current facility-administered medications for this visit.    Allergies-reviewed and updated No Known Allergies  Social History   Social History Narrative   Fun/Hobby: Work on cars, mountain biking   Objective  Objective:  BP 129/84    Pulse 86    Temp 97.7 F (36.5 C) (Temporal)    Ht 5\' 10"  (1.778 m)    Wt 250 lb (113.4 kg)    SpO2 97%    BMI 35.87 kg/m  Gen: NAD, resting comfortably HEENT: Mucous membranes are moist. Oropharynx normal Neck: no thyromegaly CV: RRR no murmurs rubs or gallops Lungs: CTAB no crackles, wheeze, rhonchi Abdomen: soft/nontender/nondistended/normal bowel sounds. No rebound or guarding.  Ext: no edema Skin: warm, dry Neuro: grossly normal, moves all extremities, PERRLA    Assessment and Plan   Health Maintenance counseling: 1. Anticipatory guidance: Patient  counseled regarding regular dental exams q6 months, eye exams yearly, avoiding smoking and second hand smoke, limiting alcohol to 2 beverages per day.   2. Risk factor reduction:  Advised patient of need for regular exercise and diet rich in fruits and vegetables to reduce risk of heart attack and stroke.    Wt Readings from Last 3 Encounters:  08/03/21 250 lb (113.4 kg)  07/23/20 269 lb 1.9 oz (122.1 kg)  02/02/17 210 lb (95.3 kg)   3. Immunizations/screenings/ancillary studies Immunization History  Administered Date(s) Administered   Influenza,inj,Quad PF,6+ Mos 07/23/2020   PFIZER(Purple Top)SARS-COV-2 Vaccination 11/10/2019, 12/01/2019, 06/13/2020   Tdap 07/23/2020   Health Maintenance Due  Topic Date Due   HIV Screening  Never done   Hepatitis C Screening  Never done   COVID-19 Vaccine (4 - Booster for Pfizer series) 08/08/2020   INFLUENZA VACCINE  03/16/2021    4. Skin cancer screening- advised regular sunscreen use. Denies worrisome, changing, or new skin lesions.  5. Smoking associated screening: non- smoker  6. STD screening - married, refuses need   Problem List Items Addressed This Visit       Other   Class 2 obesity due to excess calories without serious comorbidity with body mass index (BMI) of 35.0 to 35.9 in adult    Pt has lost 19lbs since last year, still working on diet and exercise to lose more.  Encouraged continued success, and offered wt loss  strategies.       Annual physical exam - Primary   Relevant Orders   Comprehensive metabolic panel   TSH   Lipid panel   CBC with Differential/Platelet    Recommended follow up: Return for as needed for future concerns.    Lab/Order associations: fasting   ICD-10-CM   1. Annual physical exam  Z00.00 Comprehensive metabolic panel    TSH    Lipid panel    CBC with Differential/Platelet    2. Class 2 obesity due to excess calories without serious comorbidity with body mass index (BMI) of 35.0 to 35.9 in  adult  E66.09    Z68.35         Dulce Sellar, NP

## 2022-03-25 DIAGNOSIS — L509 Urticaria, unspecified: Secondary | ICD-10-CM | POA: Diagnosis not present

## 2022-03-25 DIAGNOSIS — R030 Elevated blood-pressure reading, without diagnosis of hypertension: Secondary | ICD-10-CM | POA: Diagnosis not present

## 2022-03-25 DIAGNOSIS — R000 Tachycardia, unspecified: Secondary | ICD-10-CM | POA: Diagnosis not present

## 2022-03-25 DIAGNOSIS — Z13220 Encounter for screening for lipoid disorders: Secondary | ICD-10-CM | POA: Diagnosis not present

## 2022-05-10 ENCOUNTER — Encounter: Payer: Self-pay | Admitting: *Deleted

## 2022-07-29 ENCOUNTER — Encounter: Payer: Self-pay | Admitting: *Deleted

## 2022-08-04 ENCOUNTER — Ambulatory Visit (INDEPENDENT_AMBULATORY_CARE_PROVIDER_SITE_OTHER): Payer: BC Managed Care – PPO | Admitting: Family

## 2022-08-04 ENCOUNTER — Encounter: Payer: Self-pay | Admitting: Family

## 2022-08-04 VITALS — BP 117/80 | HR 84 | Temp 97.3°F | Ht 70.0 in | Wt 238.8 lb

## 2022-08-04 DIAGNOSIS — E66811 Obesity, class 1: Secondary | ICD-10-CM | POA: Insufficient documentation

## 2022-08-04 DIAGNOSIS — Z0000 Encounter for general adult medical examination without abnormal findings: Secondary | ICD-10-CM

## 2022-08-04 DIAGNOSIS — E669 Obesity, unspecified: Secondary | ICD-10-CM | POA: Diagnosis not present

## 2022-08-04 LAB — COMPREHENSIVE METABOLIC PANEL
ALT: 25 U/L (ref 0–53)
AST: 19 U/L (ref 0–37)
Albumin: 4.8 g/dL (ref 3.5–5.2)
Alkaline Phosphatase: 70 U/L (ref 39–117)
BUN: 23 mg/dL (ref 6–23)
CO2: 29 mEq/L (ref 19–32)
Calcium: 9.7 mg/dL (ref 8.4–10.5)
Chloride: 102 mEq/L (ref 96–112)
Creatinine, Ser: 0.94 mg/dL (ref 0.40–1.50)
GFR: 106.95 mL/min (ref >60.00)
Glucose, Bld: 87 mg/dL (ref 70–99)
Potassium: 4.5 mEq/L (ref 3.5–5.1)
Sodium: 142 mEq/L (ref 135–145)
Total Bilirubin: 0.7 mg/dL (ref 0.2–1.2)
Total Protein: 7.5 g/dL (ref 6.0–8.3)

## 2022-08-04 LAB — LIPID PANEL
Cholesterol: 200 mg/dL (ref 0–200)
HDL: 54 mg/dL (ref >39.00)
LDL Cholesterol: 132 mg/dL — ABNORMAL HIGH (ref 0–99)
NonHDL: 145.96
Total CHOL/HDL Ratio: 4
Triglycerides: 68 mg/dL (ref 0.0–149.0)
VLDL: 13.6 mg/dL (ref 0.0–40.0)

## 2022-08-04 LAB — CBC WITH DIFFERENTIAL/PLATELET
Basophils Absolute: 0 10*3/uL (ref 0.0–0.1)
Basophils Relative: 0.4 % (ref 0.0–3.0)
Eosinophils Absolute: 0.3 10*3/uL (ref 0.0–0.7)
Eosinophils Relative: 3.1 % (ref 0.0–5.0)
HCT: 46.2 % (ref 39.0–52.0)
Hemoglobin: 16.2 g/dL (ref 13.0–17.0)
Lymphocytes Relative: 24 % (ref 12.0–46.0)
Lymphs Abs: 2 10*3/uL (ref 0.7–4.0)
MCHC: 35 g/dL (ref 30.0–36.0)
MCV: 89.1 fl (ref 78.0–100.0)
Monocytes Absolute: 0.6 10*3/uL (ref 0.1–1.0)
Monocytes Relative: 6.6 % (ref 3.0–12.0)
Neutro Abs: 5.5 10*3/uL (ref 1.4–7.7)
Neutrophils Relative %: 65.9 % (ref 43.0–77.0)
Platelets: 294 10*3/uL (ref 150.0–400.0)
RBC: 5.18 Mil/uL (ref 4.22–5.81)
RDW: 13.1 % (ref 11.5–15.5)
WBC: 8.4 10*3/uL (ref 4.0–10.5)

## 2022-08-04 NOTE — Progress Notes (Signed)
Phone: [phone]  Subjective:  Patient 31 y.o. male presenting for annual physical.  Chief Complaint  Patient presents with   Annual Exam    Fasting w/ Labs    See problem oriented charting- ROS- full  review of systems was completed and negative    The following were reviewed and entered/updated in epic: Past Medical History:  Diagnosis Date   Annual physical exam 08/03/2021   Class 2 obesity due to excess calories without serious comorbidity with body mass index (BMI) of 35.0 to 35.9 in adult 07/23/2020   Headache    Routine adult health maintenance 02/02/2017   Patient Active Problem List   Diagnosis Date Noted   Obesity (BMI 30.0-34.9) 08/04/2022   Snoring 07/23/2020   No past surgical history on file.  Family History  Problem Relation Age of Onset   Healthy Mother    Hypertension Father    Cancer Paternal Aunt        colon   Diabetes Maternal Grandmother    Hypertension Maternal Grandfather     Medications- reviewed and updated Current Outpatient Medications  Medication Sig Dispense Refill   Multiple Vitamin (MULTIVITAMIN WITH MINERALS) TABS tablet Take 1 tablet by mouth daily.     No current facility-administered medications for this visit.    Allergies-reviewed and updated No Known Allergies  Social History   Social History Narrative   Fun/Hobby: Work on cars, mountain biking    Objective:  BP 117/80 (BP Location: Left Arm, Patient Position: Sitting, Cuff Size: Large)   Pulse 84   Temp (!) 97.3 F (36.3 C) (Temporal)   Ht 5\' 10"  (1.778 m)   Wt 238 lb 12.8 oz (108.3 kg)   SpO2 97%   BMI 34.26 kg/m  Physical Exam Vitals and nursing note reviewed.  Constitutional:      General: He is not in acute distress.    Appearance: Normal appearance.  HENT:     Head: Normocephalic.     Right Ear: Tympanic membrane and external ear normal.     Left Ear: Tympanic membrane and external ear normal.     Nose: Nose normal.     Mouth/Throat:      Mouth: Mucous membranes are moist.  Eyes:     Extraocular Movements: Extraocular movements intact.  Cardiovascular:     Rate and Rhythm: Normal rate and regular rhythm.  Pulmonary:     Effort: Pulmonary effort is normal.     Breath sounds: Normal breath sounds.  Abdominal:     General: Abdomen is flat. There is no distension.     Palpations: Abdomen is soft.     Tenderness: There is no abdominal tenderness.  Musculoskeletal:        General: Normal range of motion.     Cervical back: Normal range of motion.  Skin:    General: Skin is warm and dry.  Neurological:     Mental Status: He is alert and oriented to person, place, and time.  Psychiatric:        Mood and Affect: Mood normal.        Behavior: Behavior normal.        Judgment: Judgment normal.      Assessment and Plan   Health Maintenance counseling: 1. Anticipatory guidance: Patient counseled regarding regular dental exams q6 months, eye exams yearly, avoiding smoking and second hand smoke, limiting alcohol to 2 beverages per day.   2. Risk factor reduction:  Advised patient of need for regular exercise and  diet rich in fruits and vegetables to reduce risk of heart attack and stroke.    Wt Readings from Last 3 Encounters:  08/04/22 238 lb 12.8 oz (108.3 kg)  08/03/21 250 lb (113.4 kg)  07/23/20 269 lb 1.9 oz (122.1 kg)   3. Immunizations/screenings/ancillary studies Immunization History  Administered Date(s) Administered   Influenza,inj,Quad PF,6+ Mos 07/23/2020   PFIZER(Purple Top)SARS-COV-2 Vaccination 11/10/2019, 12/01/2019, 06/13/2020   Tdap 07/23/2020   Health Maintenance Due  Topic Date Due   HIV Screening  Never done   Hepatitis C Screening  Never done   INFLUENZA VACCINE  03/16/2022   COVID-19 Vaccine (4 - 2023-24 season) 04/16/2022    4. Skin cancer screening-  advised regular sunscreen use. Denies worrisome, changing, or new skin lesions.  5. Smoking associated screening: non- smoker 6. STD  screening - married, denies need 7. Alcohol screening: none  Problem List Items Addressed This Visit       Other   Obesity (BMI 30.0-34.9)    chronic pt has lost 13lbs since last year! encouragement provided, continue POC Wt. Loss strategies reviewed including portion control, less carbs including sweets, eating most of calories earlier in day, drinking 64oz water qd, and establishing daily exercise routine.       Relevant Orders   Lipid panel   Other Visit Diagnoses     Annual physical exam    -  Primary   Relevant Orders   Comprehensive metabolic panel   CBC with Differential/Platelet      Recommended follow up: Return for Complete physical w/fasting labs, any future concerns. No future appointments.    Lab/Order associations:  fasting   Dulce Sellar, NP

## 2022-08-04 NOTE — Assessment & Plan Note (Signed)
chronic pt has lost 13lbs since last year! encouragement provided, continue POC Wt. Loss strategies reviewed including portion control, less carbs including sweets, eating most of calories earlier in day, drinking 64oz water qd, and establishing daily exercise routine.

## 2022-08-04 NOTE — Patient Instructions (Signed)
It was very nice to see you today!   I will review your lab results via MyChart in a few days.   Have a wonderful holiday!!      PLEASE NOTE:  If you had any lab tests please let us know if you have not heard back within a few days. You may see your results on MyChart before we have a chance to review them but we will give you a call once they are reviewed by [place]. If we ordered any referrals today, please let us know if you have not heard from their office within the next week.

## 2022-08-06 NOTE — Progress Notes (Signed)
Your glucose, electrolytes, blood count, thyroid, liver & kidney function are all normal.  Your cholesterol numbers look good, just your LDL (bad #) is high this time, despite your weight loss! But don't be discouraged, more important to continue to lose weight. Try to reduce intake of any fried foods, alcohol, nonnutritional snacks e.g. chips/cookies,pies, cakes and candies, fatty meat (red meat), high fat dairy foods:  including cheese, milk, ice cream.  Increase whole foods: fruits/vegetables/fiber.   Continue or restart an exercise routine, shooting for 5-7days per week.

## 2022-12-29 ENCOUNTER — Telehealth: Payer: Self-pay | Admitting: Physician Assistant

## 2022-12-29 DIAGNOSIS — H66002 Acute suppurative otitis media without spontaneous rupture of ear drum, left ear: Secondary | ICD-10-CM

## 2022-12-29 MED ORDER — AMOXICILLIN-POT CLAVULANATE 875-125 MG PO TABS: 1.0000 | ORAL_TABLET | Freq: Two times a day (BID) | ORAL | 0 refills | Status: DC

## 2022-12-29 NOTE — Progress Notes (Signed)

## 2023-03-28 ENCOUNTER — Ambulatory Visit (INDEPENDENT_AMBULATORY_CARE_PROVIDER_SITE_OTHER): Payer: BC Managed Care – PPO | Admitting: Family

## 2023-03-28 VITALS — BP 110/75 | HR 72 | Temp 98.0°F | Ht 70.0 in | Wt 229.8 lb

## 2023-03-28 DIAGNOSIS — M542 Cervicalgia: Secondary | ICD-10-CM | POA: Diagnosis not present

## 2023-03-28 NOTE — Progress Notes (Signed)
Patient ID: Joseph Kennedy, male    DOB: [birth year], 32 y.o.   MRN: [id number]  Chief Complaint  Patient presents with   Neck Pain    Pt c/o neck, ear and shoulder pain on left side, Present for 3 weeks. Has tried ibuprofen which did not help sx. Pain is dull and worse when laying down.     HPI:      Neck pain:  The patient reports neck pain that radiates towards the ear - The pain is described as a dull ache, with a severity of 1-2 when sitting and 3-4 when lying down. - The pain has been present for a few weeks. - The patient had an ear infection in April and was treated with amoxicillin, which resolved the infection. - The patient does not feel ear congestion currently but experiences temporary relief when popping the ear or neck. - Ibuprofen was taken for a week or two without relief. - The patient works at a [employer] and performs household chores. - Reports trying to apply Heat and analgesic creams which provide temporary relief.      Assessment & Plan:  1. Neck pain on left side possibly related to a resolving lymph node issue or residual effects from the previous ear infection. Ordering  soft tissue ultrasound to rule out any concerns or show resolving lymph node. - Use analgesic rub creams like Tiger Balm or lidocaine cream/patches and/or heat up to tid prn for pain relief. F/U in 2w if U/S neg and sx are not improved.  - US SOFT TISSUE HEAD & NECK (NON-THYROID); Future   Subjective:    Outpatient Medications Prior to Visit  Medication Sig Dispense Refill   Multiple Vitamin (MULTIVITAMIN WITH MINERALS) TABS tablet Take 1 tablet by mouth daily.     amoxicillin-clavulanate (AUGMENTIN) 875-125 MG tablet Take 1 tablet by mouth 2 (two) times daily. (Patient not taking: Reported on 03/28/2023) 20 tablet 0   No facility-administered medications prior to visit.   Past Medical History:  Diagnosis Date   Annual physical exam 08/03/2021   Class 2 obesity due to excess  calories without serious comorbidity with body mass index (BMI) of 35.0 to 35.9 in adult 07/23/2020   Headache    Routine adult health maintenance 02/02/2017   No past surgical history on file. No Known Allergies    Objective:    Physical Exam Vitals and nursing note reviewed.  Constitutional:      General: He is not in acute distress.    Appearance: Normal appearance.  HENT:     Head: Normocephalic.     Right Ear: Ear canal and external ear normal.     Left Ear: Ear canal and external ear normal.     Mouth/Throat:     Mouth: Mucous membranes are moist.     Pharynx: No pharyngeal swelling, oropharyngeal exudate, posterior oropharyngeal erythema or uvula swelling.     Tonsils: No tonsillar exudate or tonsillar abscesses.  Cardiovascular:     Rate and Rhythm: Normal rate and regular rhythm.  Pulmonary:     Effort: Pulmonary effort is normal.     Breath sounds: Normal breath sounds.  Musculoskeletal:        General: Normal range of motion.     Cervical back: Normal range of motion. No erythema or rigidity. No pain with movement or muscular tenderness. Normal range of motion.  Lymphadenopathy:     Head:     Right side of head: No  tonsillar, preauricular, posterior auricular or occipital adenopathy.     Left side of head: No tonsillar, preauricular, posterior auricular or occipital adenopathy.     Cervical: No cervical adenopathy.  Skin:    General: Skin is warm and dry.  Neurological:     Mental Status: He is alert and oriented to person, place, and time.  Psychiatric:        Mood and Affect: Mood normal.    BP 110/75   Pulse 72   Temp 98 F (36.7 C) (Temporal)   Ht 5\' 10"  (1.778 m)   Wt 229 lb 12.8 oz (104.2 kg)   SpO2 99%   BMI 32.97 kg/m  Wt Readings from Last 3 Encounters:  03/28/23 229 lb 12.8 oz (104.2 kg)  08/04/22 238 lb 12.8 oz (108.3 kg)  08/03/21 250 lb (113.4 kg)       Dulce Sellar, NP

## 2023-03-28 NOTE — Patient Instructions (Signed)
It was very nice to see you today!   I will review your lab results via MyChart in a few days.   Have a great rest of the week!   PLEASE NOTE:  If you had any lab tests please let us know if you have not heard back within a few days. You may see your results on MyChart before we have a chance to review them but we will give you a call once they are reviewed by us. If we ordered any referrals today, please let us know if you have not heard from their office within the next week.    

## 2023-04-01 ENCOUNTER — Ambulatory Visit: Admission: RE | Admit: 2023-04-01 | Payer: BC Managed Care – PPO | Source: Ambulatory Visit

## 2023-04-01 DIAGNOSIS — M542 Cervicalgia: Secondary | ICD-10-CM

## 2023-08-08 ENCOUNTER — Encounter: Payer: Self-pay | Admitting: Family

## 2023-08-11 ENCOUNTER — Ambulatory Visit (INDEPENDENT_AMBULATORY_CARE_PROVIDER_SITE_OTHER): Payer: BC Managed Care – PPO | Admitting: Family

## 2023-08-11 ENCOUNTER — Encounter: Payer: Self-pay | Admitting: Family

## 2023-08-11 VITALS — BP 124/83 | HR 73 | Temp 97.2°F | Ht 70.0 in | Wt 240.5 lb

## 2023-08-11 DIAGNOSIS — R7989 Other specified abnormal findings of blood chemistry: Secondary | ICD-10-CM | POA: Diagnosis not present

## 2023-08-11 DIAGNOSIS — Z0000 Encounter for general adult medical examination without abnormal findings: Secondary | ICD-10-CM | POA: Diagnosis not present

## 2023-08-11 DIAGNOSIS — Z1159 Encounter for screening for other viral diseases: Secondary | ICD-10-CM

## 2023-08-11 DIAGNOSIS — Z114 Encounter for screening for human immunodeficiency virus [HIV]: Secondary | ICD-10-CM

## 2023-08-11 DIAGNOSIS — E66811 Obesity, class 1: Secondary | ICD-10-CM

## 2023-08-11 DIAGNOSIS — H9202 Otalgia, left ear: Secondary | ICD-10-CM

## 2023-08-11 LAB — LIPID PANEL
Cholesterol: 150 mg/dL (ref 0–200)
HDL: 53.8 mg/dL (ref >39.00)
LDL Cholesterol: 85 mg/dL (ref 0–99)
NonHDL: 96.23
Total CHOL/HDL Ratio: 3
Triglycerides: 57 mg/dL (ref 0.0–149.0)
VLDL: 11.4 mg/dL (ref 0.0–40.0)

## 2023-08-11 LAB — CBC WITH DIFFERENTIAL/PLATELET
Basophils Absolute: 0 10*3/uL (ref 0.0–0.1)
Basophils Relative: 0.4 % (ref 0.0–3.0)
Eosinophils Absolute: 0.4 10*3/uL (ref 0.0–0.7)
Eosinophils Relative: 6.2 % — ABNORMAL HIGH (ref 0.0–5.0)
HCT: 43.9 % (ref 39.0–52.0)
Hemoglobin: 15.1 g/dL (ref 13.0–17.0)
Lymphocytes Relative: 30.7 % (ref 12.0–46.0)
Lymphs Abs: 2 10*3/uL (ref 0.7–4.0)
MCHC: 34.4 g/dL (ref 30.0–36.0)
MCV: 90.7 fL (ref 78.0–100.0)
Monocytes Absolute: 0.4 10*3/uL (ref 0.1–1.0)
Monocytes Relative: 6.5 % (ref 3.0–12.0)
Neutro Abs: 3.6 10*3/uL (ref 1.4–7.7)
Neutrophils Relative %: 56.2 % (ref 43.0–77.0)
Platelets: 272 10*3/uL (ref 150.0–400.0)
RBC: 4.85 Mil/uL (ref 4.22–5.81)
RDW: 12.6 % (ref 11.5–15.5)
WBC: 6.4 10*3/uL (ref 4.0–10.5)

## 2023-08-11 LAB — COMPREHENSIVE METABOLIC PANEL
ALT: 18 U/L (ref 0–53)
AST: 19 U/L (ref 0–37)
Albumin: 4.2 g/dL (ref 3.5–5.2)
Alkaline Phosphatase: 55 U/L (ref 39–117)
BUN: 14 mg/dL (ref 6–23)
CO2: 29 meq/L (ref 19–32)
Calcium: 9.1 mg/dL (ref 8.4–10.5)
Chloride: 105 meq/L (ref 96–112)
Creatinine, Ser: 0.72 mg/dL (ref 0.40–1.50)
GFR: 119.68 mL/min (ref >60.00)
Glucose, Bld: 96 mg/dL (ref 70–99)
Potassium: 3.7 meq/L (ref 3.5–5.1)
Sodium: 141 meq/L (ref 135–145)
Total Bilirubin: 0.7 mg/dL (ref 0.2–1.2)
Total Protein: 6.7 g/dL (ref 6.0–8.3)

## 2023-08-11 NOTE — Progress Notes (Signed)
Phone: [phone]  Subjective:  Patient 32 y.o. male presenting for annual physical.  Chief Complaint  Patient presents with   Annual Exam    Fasting w/ labs     See problem oriented charting- ROS- full  review of systems was completed and negative    The following were reviewed and entered/updated in epic: Past Medical History:  Diagnosis Date   Annual physical exam 08/03/2021   Class 2 obesity due to excess calories without serious comorbidity with body mass index (BMI) of 35.0 to 35.9 in adult 07/23/2020   Headache    Routine adult health maintenance 02/02/2017   Patient Active Problem List   Diagnosis Date Noted   Obesity (BMI 30.0-34.9) 08/04/2022   Snoring 07/23/2020   No past surgical history on file.  Family History  Problem Relation Age of Onset   Healthy Mother    Hypertension Father    Cancer Paternal Aunt        colon   Diabetes Maternal Grandmother    Hypertension Maternal Grandfather     Medications- reviewed and updated Current Outpatient Medications  Medication Sig Dispense Refill   Multiple Vitamin (MULTIVITAMIN WITH MINERALS) TABS tablet Take 1 tablet by mouth daily.     No current facility-administered medications for this visit.    Allergies-reviewed and updated No Known Allergies  Social History   Social History Narrative   Fun/Hobby: Work on cars, mountain biking    Objective:  BP 124/83 (BP Location: Left Arm, Patient Position: Sitting, Cuff Size: Large)   Pulse 73   Temp (!) 97.2 F (36.2 C) (Temporal)   Ht 5\' 10"  (1.778 m)   Wt 240 lb 8 oz (109.1 kg)   SpO2 98%   BMI 34.51 kg/m  Physical Exam Vitals and nursing note reviewed.  Constitutional:      General: He is not in acute distress.    Appearance: Normal appearance.  HENT:     Head: Normocephalic.     Right Ear: External ear normal. Tympanic membrane is retracted (mild).     Left Ear: External ear normal. No decreased hearing noted. No drainage. A middle ear  effusion (mild) is present. Tympanic membrane is not injected or erythematous.     Nose: Nose normal.     Mouth/Throat:     Mouth: Mucous membranes are moist.  Eyes:     Extraocular Movements: Extraocular movements intact.  Cardiovascular:     Rate and Rhythm: Normal rate and regular rhythm.  Pulmonary:     Effort: Pulmonary effort is normal.     Breath sounds: Normal breath sounds.  Abdominal:     General: Abdomen is flat. There is no distension.     Palpations: Abdomen is soft.     Tenderness: There is no abdominal tenderness.  Musculoskeletal:        General: Normal range of motion.     Cervical back: Normal range of motion.  Skin:    General: Skin is warm and dry.  Neurological:     Mental Status: He is alert and oriented to person, place, and time.  Psychiatric:        Mood and Affect: Mood normal.        Behavior: Behavior normal.        Judgment: Judgment normal.      Assessment and Plan   Health Maintenance counseling: 1. Anticipatory guidance: Patient counseled regarding regular dental exams q6 months, eye exams yearly, avoiding smoking and second hand smoke, limiting alcohol  to 2 beverages per day.   2. Risk factor reduction:  Advised patient of need for regular exercise and diet rich in fruits and vegetables to reduce risk of heart attack and stroke.    Wt Readings from Last 3 Encounters:  08/11/23 240 lb 8 oz (109.1 kg)  03/28/23 229 lb 12.8 oz (104.2 kg)  08/04/22 238 lb 12.8 oz (108.3 kg)   3. Immunizations/screenings/ancillary studies Immunization History  Administered Date(s) Administered   Influenza,inj,Quad PF,6+ Mos 07/23/2020, 05/31/2023   PFIZER(Purple Top)SARS-COV-2 Vaccination 11/10/2019, 12/01/2019, 06/13/2020   Pfizer Covid-19 Vaccine Bivalent Booster 15yrs & up 05/31/2023   Tdap 07/23/2020   Health Maintenance Due  Topic Date Due   HIV Screening  Never done    4. Skin cancer screening-  advised regular sunscreen use. Denies worrisome,  changing, or new skin lesions.  5. Smoking associated screening: non- smoker 6. STD screening - married, denies need 7. Alcohol screening: none    High LDL -  Elevated LDL cholesterol last year. Patient has made dietary changes and increased exercise. -Order lipid panel to reassess cholesterol levels.  Weight Management/Obesity - Noted weight gain. Patient is exercising and has made dietary changes. -Encourage continued exercise and healthy diet.  Ear Pain and Dizziness - Patient reports intermittent left ear pain and dizziness, particularly when lying down. No signs of acute infection on examination, but eardrums appear slightly dull and retracted, suggesting possible residual fluid or eustachian tube dysfunction. -Recommend over-the-counter Flonase or Nasacort nasal spray, 1 squirt each side twice daily for a few days, then daily. -Advise patient to return if pain persists or if fever develops.  Annual PHysical -  -Order cbc w/diff, CMP,  lipids, HIV and Hepatitis C tests. -Encourage patient to continue good oral hygiene practices and .drinking 2L water daily.      Recommended follow up: Return for any future concerns, Complete physical w/fasting labs. Future Appointments  Date Time Provider Department Center  08/13/2024  8:00 AM Dulce Sellar, NP [facility]     Lab/Order associations:  fasting   Dulce Sellar, NP

## 2023-08-11 NOTE — Patient Instructions (Signed)
It was very nice to see you today!   I will review your lab results via MyChart in a few days.   Happy New Year!        PLEASE NOTE:  If you had any lab tests please let us know if you have not heard back within a few days. You may see your results on MyChart before we have a chance to review them but we will give you a call once they are reviewed by [place]. If we ordered any referrals today, please let us know if you have not heard from their office within the next week.

## 2023-08-12 LAB — HEPATITIS C ANTIBODY: Hepatitis C Ab: NONREACTIVE

## 2023-08-12 LAB — HIV ANTIBODY (ROUTINE TESTING W REFLEX): HIV 1&2 Ab, 4th Generation: NONREACTIVE

## 2024-08-13 ENCOUNTER — Ambulatory Visit: Payer: BC Managed Care – PPO | Admitting: Family

## 2024-08-13 ENCOUNTER — Encounter: Payer: Self-pay | Admitting: Family

## 2024-08-13 VITALS — BP 150/98 | HR 68 | Temp 97.5°F | Ht 70.0 in | Wt 241.2 lb

## 2024-08-13 DIAGNOSIS — Z0000 Encounter for general adult medical examination without abnormal findings: Secondary | ICD-10-CM

## 2024-08-13 DIAGNOSIS — Z8639 Personal history of other endocrine, nutritional and metabolic disease: Secondary | ICD-10-CM | POA: Diagnosis not present

## 2024-08-13 DIAGNOSIS — R0683 Snoring: Secondary | ICD-10-CM | POA: Diagnosis not present

## 2024-08-13 DIAGNOSIS — E66811 Obesity, class 1: Secondary | ICD-10-CM | POA: Diagnosis not present

## 2024-08-13 DIAGNOSIS — R030 Elevated blood-pressure reading, without diagnosis of hypertension: Secondary | ICD-10-CM | POA: Diagnosis not present

## 2024-08-13 LAB — CBC WITH DIFFERENTIAL/PLATELET
Basophils Absolute: 0 K/uL (ref 0.0–0.1)
Basophils Relative: 0.4 % (ref 0.0–3.0)
Eosinophils Absolute: 0.3 K/uL (ref 0.0–0.7)
Eosinophils Relative: 3.9 % (ref 0.0–5.0)
HCT: 46 % (ref 39.0–52.0)
Hemoglobin: 15.7 g/dL (ref 13.0–17.0)
Lymphocytes Relative: 24.9 % (ref 12.0–46.0)
Lymphs Abs: 1.9 K/uL (ref 0.7–4.0)
MCHC: 34.2 g/dL (ref 30.0–36.0)
MCV: 89.5 fl (ref 78.0–100.0)
Monocytes Absolute: 0.6 K/uL (ref 0.1–1.0)
Monocytes Relative: 7.5 % (ref 3.0–12.0)
Neutro Abs: 4.7 K/uL (ref 1.4–7.7)
Neutrophils Relative %: 63.3 % (ref 43.0–77.0)
Platelets: 272 K/uL (ref 150.0–400.0)
RBC: 5.14 Mil/uL (ref 4.22–5.81)
RDW: 12.4 % (ref 11.5–15.5)
WBC: 7.5 K/uL (ref 4.0–10.5)

## 2024-08-13 LAB — COMPREHENSIVE METABOLIC PANEL WITH GFR
ALT: 26 U/L (ref 3–53)
AST: 32 U/L (ref 5–37)
Albumin: 4.6 g/dL (ref 3.5–5.2)
Alkaline Phosphatase: 58 U/L (ref 39–117)
BUN: 24 mg/dL — ABNORMAL HIGH (ref 6–23)
CO2: 27 meq/L (ref 19–32)
Calcium: 9.7 mg/dL (ref 8.4–10.5)
Chloride: 104 meq/L (ref 96–112)
Creatinine, Ser: 0.85 mg/dL (ref 0.40–1.50)
GFR: 113.02 mL/min
Glucose, Bld: 91 mg/dL (ref 70–99)
Potassium: 4.8 meq/L (ref 3.5–5.1)
Sodium: 141 meq/L (ref 135–145)
Total Bilirubin: 0.9 mg/dL (ref 0.2–1.2)
Total Protein: 7.4 g/dL (ref 6.0–8.3)

## 2024-08-13 LAB — LIPID PANEL
Cholesterol: 185 mg/dL (ref 28–200)
HDL: 51.7 mg/dL
LDL Cholesterol: 119 mg/dL — ABNORMAL HIGH (ref 10–99)
NonHDL: 133.66
Total CHOL/HDL Ratio: 4
Triglycerides: 72 mg/dL (ref 10.0–149.0)
VLDL: 14.4 mg/dL (ref 0.0–40.0)

## 2024-08-13 NOTE — Patient Instructions (Addendum)
 It was very nice to see you today!   You look great! Stay well! Keep exercising!  Happy new year :-)       PLEASE NOTE:  If you had any lab tests please let us  know if you have not heard back within a few days. You may see your results on MyChart before we have a chance to review them but we will give you a call once they are reviewed by us . If we ordered any referrals today, please let us  know if you have not heard from their office within the next week.

## 2024-08-13 NOTE — Progress Notes (Signed)
 " Phone: [phone]  Subjective:  Patient 33 y.o. male presenting for annual physical.  Chief Complaint  Patient presents with   Annual Exam    Fasting w/ labs    See problem oriented charting- ROS- full  review of systems was completed and negative.  The following were reviewed and entered/updated in epic: Past Medical History:  Diagnosis Date   Annual physical exam 08/03/2021   Class 2 obesity due to excess calories without serious comorbidity with body mass index (BMI) of 35.0 to 35.9 in adult 07/23/2020   Headache    Routine adult health maintenance 02/02/2017   Patient Active Problem List   Diagnosis Date Noted   History of high cholesterol 08/13/2024   Obesity (BMI 30.0-34.9) 08/04/2022   Snoring 07/23/2020   History reviewed. No pertinent surgical history.  Family History  Problem Relation Age of Onset   Healthy Mother    Hypertension Father    Cancer Paternal Aunt        colon   Diabetes Maternal Grandmother    Hypertension Maternal Grandfather     Medications- reviewed and updated Current Outpatient Medications  Medication Sig Dispense Refill   Multiple Vitamin (MULTIVITAMIN WITH MINERALS) TABS tablet Take 1 tablet by mouth daily.     No current facility-administered medications for this visit.    Allergies-reviewed and updated No Known Allergies  Social History   Social History Narrative   Fun/Hobby: Work on cars, mountain biking    Objective:  BP (!) 150/98 (BP Location: Left Arm, Patient Position: Sitting, Cuff Size: Large)   Pulse 68   Temp (!) 97.5 F (36.4 C) (Temporal)   Ht 5' 10 (1.778 m)   Wt 241 lb 4 oz (109.4 kg)   SpO2 98%   BMI 34.62 kg/m  Physical Exam Vitals and nursing note reviewed.  Constitutional:      General: He is not in acute distress.    Appearance: Normal appearance. He is obese.  HENT:     Head: Normocephalic.     Right Ear: External ear normal.     Left Ear: External ear normal.     Nose: Nose normal.      Mouth/Throat:     Mouth: Mucous membranes are moist.  Eyes:     Extraocular Movements: Extraocular movements intact.  Cardiovascular:     Rate and Rhythm: Normal rate and regular rhythm.  Pulmonary:     Effort: Pulmonary effort is normal.     Breath sounds: Normal breath sounds.  Abdominal:     General: Abdomen is flat. There is no distension.     Palpations: Abdomen is soft.     Tenderness: There is no abdominal tenderness.  Musculoskeletal:        General: Normal range of motion.     Cervical back: Normal range of motion.  Skin:    General: Skin is warm and dry.  Neurological:     Mental Status: He is alert and oriented to person, place, and time.  Psychiatric:        Mood and Affect: Mood normal.        Behavior: Behavior normal.        Judgment: Judgment normal.      Assessment and Plan   Health Maintenance counseling: 1. Anticipatory guidance: Patient counseled regarding regular dental exams q6 months, eye exams yearly, avoiding smoking and second hand smoke, limiting alcohol to 2 beverages per day.   2. Risk factor reduction:  Advised patient of need  for regular exercise and diet rich in fruits and vegetables to reduce risk of heart attack and stroke.    Wt Readings from Last 3 Encounters:  08/13/24 241 lb 4 oz (109.4 kg)  08/11/23 240 lb 8 oz (109.1 kg)  03/28/23 229 lb 12.8 oz (104.2 kg)   3. Immunizations/screenings/ancillary studies Immunization History  Administered Date(s) Administered   Hepatitis B 05/15/2002, 06/20/2002, 11/20/2002   Influenza,inj,Quad PF,6+ Mos 07/23/2020, 05/31/2023, 07/03/2024   PFIZER(Purple Top)SARS-COV-2 Vaccination 11/10/2019, 12/01/2019, 06/13/2020   Pfizer Covid-19 Vaccine Bivalent Booster 18yrs & up 06/30/2021, 05/19/2022, 05/31/2023, 07/03/2024   Tdap 07/23/2020   There are no preventive care reminders to display for this patient.   4. Skin cancer screening-  advised regular sunscreen use. Denies worrisome, changing, or  new skin lesions.  5. Smoking associated screening: non- smoker 6. STD screening - married, denies need 7. Alcohol screening: none 8. Exercise -  cardio and weight lifting 5d/week  Assessment & Plan Evaluation for sleep-disordered breathing Possible mild sleep-disordered breathing affecting sleep quality and cardiovascular health. - Referred to pulmonary office for sleep study evaluation.  Elevated blood pressure  Past readings all within normal range. Worked out prior to coming in, denies any caffeine use, not feeling stressed, but slightly anxious about CPE. - Advised on continued exercise, reduce sodium in diet, increase water intake. - Will continue to monitor  General Health Maintenance Engages in regular exercise and maintains a healthy diet. No alcohol, tobacco, or drug use. Received flu shot and COVID booster. - Order full CPE lab panel - pt aware he may need to pay for lipid panel separately as last year wnl. - Continue regular exercise and healthy diet. - Maintain avoidance of alcohol, tobacco, and drugs.   Recommended follow up: Return for any future concerns, Complete physical w/fasting labs. No future appointments.   Lab/Order associations:  fasting   Lucius Krabbe, NP   "

## 2024-08-14 ENCOUNTER — Ambulatory Visit: Payer: Self-pay | Admitting: Family

## 2024-08-24 ENCOUNTER — Ambulatory Visit: Admitting: Sleep Medicine

## 2024-08-24 ENCOUNTER — Encounter: Payer: Self-pay | Admitting: Sleep Medicine

## 2024-08-24 VITALS — BP 100/80 | HR 96 | Temp 98.1°F | Ht 70.0 in | Wt 237.0 lb

## 2024-08-24 DIAGNOSIS — G4733 Obstructive sleep apnea (adult) (pediatric): Secondary | ICD-10-CM

## 2024-08-24 DIAGNOSIS — I10 Essential (primary) hypertension: Secondary | ICD-10-CM | POA: Diagnosis not present

## 2024-08-24 DIAGNOSIS — R0683 Snoring: Secondary | ICD-10-CM | POA: Diagnosis not present

## 2024-08-24 DIAGNOSIS — E669 Obesity, unspecified: Secondary | ICD-10-CM

## 2024-08-24 DIAGNOSIS — G4710 Hypersomnia, unspecified: Secondary | ICD-10-CM

## 2024-08-24 NOTE — Progress Notes (Signed)
 "      Name:Brysin JAKYE MULLENS MRN: [id number] DOB: [birth year]   CHIEF COMPLAINT:  EXCESSIVE DAYTIME SLEEPINESS   HISTORY OF PRESENT ILLNESS: Mr. Gruenhagen is a 34 y.o. w/ a h/o obesity who present for c/o loud snoring and witnessed apnea which has been present for several years. Denies any nocturnal awakenings. Denies any significant weight changes. Admit to dry mouth. Denies morning headaches, RLS symptoms, dream enactment, cataplexy, hypnagogic or hypnapompic hallucinations. Denies a family history of sleep apnea. Denies drowsy driving. Denies alcohol, tobacco or illicit drug use.   Bedtime 10 pm Sleep onset 20 mins Rise time 4:45 am   EPWORTH SLEEP SCORE 3    08/24/2024    8:00 AM  Results of the Epworth flowsheet  Sitting and reading 0  Watching TV 2  Sitting, inactive in a public place (e.g. a theatre or a meeting) 0  As a passenger in a car for an hour without a break 0  Lying down to rest in the afternoon when circumstances permit 1  Sitting and talking to someone 0  Sitting quietly after a lunch without alcohol 0  In a car, while stopped for a few minutes in traffic 0  Total score 3    PAST MEDICAL HISTORY :   has a past medical history of Annual physical exam (08/03/2021), Class 2 obesity due to excess calories without serious comorbidity with body mass index (BMI) of 35.0 to 35.9 in adult (07/23/2020), Headache, and Routine adult health maintenance (02/02/2017).  has no past surgical history on file. Prior to Admission medications  Medication Sig Start Date End Date Taking? Authorizing Provider  Multiple Vitamin (MULTIVITAMIN WITH MINERALS) TABS tablet Take 1 tablet by mouth daily.   Yes [provider]   Allergies[1]  FAMILY HISTORY:  family history includes Cancer in his paternal aunt; Diabetes in his maternal grandmother; Healthy in his mother; Hypertension in his father and maternal grandfather. SOCIAL HISTORY:  reports that he has never smoked. He has  never used smokeless tobacco. He reports that he does not drink alcohol and does not use drugs.   Review of Systems:  Gen:  Denies  fever, sweats, chills weight loss  HEENT: Denies blurred vision, double vision, ear pain, eye pain, hearing loss, nose bleeds, sore throat Cardiac:  No dizziness, chest pain or heaviness, chest tightness,edema, No JVD Resp:   No cough, -sputum production, -shortness of breath,-wheezing, -hemoptysis,  Gi: Denies swallowing difficulty, stomach pain, nausea or vomiting, diarrhea, constipation, bowel incontinence Gu:  Denies bladder incontinence, burning urine Ext:   Denies Joint pain, stiffness or swelling Skin: Denies  skin rash, easy bruising or bleeding or hives Endoc:  Denies polyuria, polydipsia , polyphagia or weight change Psych:   Denies depression, insomnia or hallucinations  Other:  All other systems negative  VITAL SIGNS: BP 100/80   Pulse 96   Temp 98.1 F (36.7 C)   Ht 5' 10 (1.778 m)   Wt 237 lb (107.5 kg)   SpO2 97%   BMI 34.01 kg/m    Physical Examination:   General Appearance: No distress  EYES PERRLA, EOM intact.   NECK Supple, No JVD Pulmonary: normal breath sounds, No wheezing.  CardiovascularNormal S1,S2.  No m/r/g.   Abdomen: Benign, Soft, non-tender. Skin:   warm, no rashes, no ecchymosis  Extremities: normal, no cyanosis, clubbing. Neuro:without focal findings,  speech normal  PSYCHIATRIC: Mood, affect within normal limits.   ASSESSMENT AND PLAN  OSA I suspect that OSA is  likely present due to clinical presentation. Discussed the consequences of untreated sleep apnea. Advised not to drive drowsy for safety of patient and others. Will complete further evaluation with a home sleep study and follow up to review results.    HTN Stable, on current management. Following with PCP.    MEDICATION ADJUSTMENTS/LABS AND TESTS ORDERED: Recommend Sleep Study   Patient  satisfied with Plan of action and management. All  questions answered  Follow up to review HST results and treatment plan.   I spent a total of 50 minutes reviewing chart data, face-to-face evaluation with the patient, counseling and coordination of care as detailed above.    Luddie Boghosian, M.D.  Sleep Medicine [facility]           [1] No Known Allergies  "

## 2024-08-24 NOTE — Patient Instructions (Signed)
 Joseph Kennedy

## 2025-08-14 ENCOUNTER — Encounter: Admitting: Family
